# Patient Record
Sex: Male | Born: 1970 | Race: Black or African American | Hispanic: No | Smoking: Never smoker
Health system: Southern US, Community
[De-identification: ages and names within clinical notes are randomized; demographics above are authoritative.]

## PROBLEM LIST (undated history)

## (undated) DIAGNOSIS — E669 Obesity, unspecified: Secondary | ICD-10-CM

## (undated) DIAGNOSIS — I10 Essential (primary) hypertension: Secondary | ICD-10-CM

## (undated) HISTORY — DX: Obesity, unspecified: E66.9

## (undated) HISTORY — DX: Essential (primary) hypertension: I10

---

## 2006-06-29 ENCOUNTER — Emergency Department (HOSPITAL_COMMUNITY): Admission: EM | Admit: 2006-06-29 | Discharge: 2006-06-29 | Payer: Self-pay | Admitting: Family Medicine

## 2010-09-19 ENCOUNTER — Ambulatory Visit (INDEPENDENT_AMBULATORY_CARE_PROVIDER_SITE_OTHER): Payer: BC Managed Care – PPO | Admitting: Internal Medicine

## 2010-09-19 ENCOUNTER — Other Ambulatory Visit (INDEPENDENT_AMBULATORY_CARE_PROVIDER_SITE_OTHER): Payer: BC Managed Care – PPO

## 2010-09-19 ENCOUNTER — Other Ambulatory Visit: Payer: Self-pay | Admitting: Internal Medicine

## 2010-09-19 ENCOUNTER — Encounter: Payer: Self-pay | Admitting: Internal Medicine

## 2010-09-19 VITALS — BP 152/98 | HR 88 | Temp 99.2°F | Ht 72.0 in | Wt 263.0 lb

## 2010-09-19 DIAGNOSIS — Z0000 Encounter for general adult medical examination without abnormal findings: Secondary | ICD-10-CM

## 2010-09-19 DIAGNOSIS — I10 Essential (primary) hypertension: Secondary | ICD-10-CM

## 2010-09-19 DIAGNOSIS — M549 Dorsalgia, unspecified: Secondary | ICD-10-CM | POA: Insufficient documentation

## 2010-09-19 DIAGNOSIS — Z23 Encounter for immunization: Secondary | ICD-10-CM

## 2010-09-19 HISTORY — DX: Essential (primary) hypertension: I10

## 2010-09-19 LAB — URINALYSIS, ROUTINE W REFLEX MICROSCOPIC
Bilirubin Urine: NEGATIVE
Hgb urine dipstick: NEGATIVE
Ketones, ur: NEGATIVE
Nitrite: NEGATIVE
Total Protein, Urine: NEGATIVE
pH: 5.5 (ref 5.0–8.0)

## 2010-09-19 LAB — CBC WITH DIFFERENTIAL/PLATELET
Basophils Relative: 0.5 % (ref 0.0–3.0)
Eosinophils Absolute: 0.1 10*3/uL (ref 0.0–0.7)
Eosinophils Relative: 0.6 % (ref 0.0–5.0)
HCT: 49 % (ref 39.0–52.0)
Lymphs Abs: 2.4 10*3/uL (ref 0.7–4.0)
MCHC: 34.1 g/dL (ref 30.0–36.0)
MCV: 87.1 fl (ref 78.0–100.0)
Monocytes Absolute: 0.7 10*3/uL (ref 0.1–1.0)
Neutrophils Relative %: 70.6 % (ref 43.0–77.0)
Platelets: 277 10*3/uL (ref 150.0–400.0)
RBC: 5.63 Mil/uL (ref 4.22–5.81)

## 2010-09-19 LAB — BASIC METABOLIC PANEL
Chloride: 103 mEq/L (ref 96–112)
GFR: 93.32 mL/min (ref >60.00)
Sodium: 141 mEq/L (ref 135–145)

## 2010-09-19 LAB — LIPID PANEL: Total CHOL/HDL Ratio: 5

## 2010-09-19 LAB — HEPATIC FUNCTION PANEL
ALT: 24 U/L (ref 0–53)
AST: 24 U/L (ref 0–37)
Bilirubin, Direct: 0.2 mg/dL (ref 0.0–0.3)
Total Bilirubin: 1.3 mg/dL — ABNORMAL HIGH (ref 0.3–1.2)

## 2010-09-19 MED ORDER — AMLODIPINE BESYLATE 5 MG PO TABS: 5.0000 mg | ORAL_TABLET | Freq: Every day | ORAL | Status: DC

## 2010-09-19 NOTE — Progress Notes (Signed)
  Subjective:    Patient ID: Eric Horne, male    DOB: 03-27-1970, 40 y.o.   MRN: [id number]  HPI  Here for wellness and f/u;  Overall doing ok;  Pt denies CP, worsening SOB, DOE, wheezing, orthopnea, PND, worsening LE edema, palpitations, dizziness or syncope.  Pt denies neurological change such as new Headache, facial or extremity weakness.  Pt denies polydipsia, polyuria, or low sugar symptoms. Pt states overall good compliance with treatment and medications, good tolerability, and trying to follow lower cholesterol diet.  Pt denies worsening depressive symptoms, suicidal ideation or panic. No fever, wt loss, night sweats, loss of appetite, or other constitutional symptoms.  Pt states good ability with ADL's, low fall risk, home safety reviewed and adequate, no significant changes in hearing or vision, and occasionally active with exercise.  Has not check BP anywhere recently. Past Medical History  Diagnosis Date  . History of chicken pox    History reviewed. No pertinent past surgical history.  reports that he has never smoked. He does not have any smokeless tobacco history on file. He reports that he does not drink alcohol or use illicit drugs. family history includes Heart disease in his mother. No Known Allergies No current outpatient prescriptions on file prior to visit.   Review of Systems Review of Systems  Constitutional: Negative for diaphoresis, activity change, appetite change and unexpected weight change.  HENT: Negative for hearing loss, ear pain, facial swelling, mouth sores and neck stiffness.   Eyes: Negative for pain, redness and visual disturbance.  Respiratory: Negative for shortness of breath and wheezing.   Cardiovascular: Negative for chest pain and palpitations.  Gastrointestinal: Negative for diarrhea, blood in stool, abdominal distention and rectal pain.  Genitourinary: Negative for hematuria, flank pain and decreased urine volume.  Musculoskeletal: Negative  for myalgias and joint swelling.  Did have some muscle pull in the lower back for 1 wk, almost resolved now, started with bending. Skin: Negative for color change and wound.  Neurological: Negative for syncope and numbness.  Hematological: Negative for adenopathy.  Psychiatric/Behavioral: Negative for hallucinations, self-injury, decreased concentration and agitation.      Objective:   Physical Exam BP 152/98  Pulse 88  Temp(Src) 99.2 F (37.3 C) (Oral)  Ht 6' (1.829 m)  Wt 263 lb (119.296 kg)  BMI 35.67 kg/m2  SpO2 95% Physical Exam  VS noted Constitutional: Pt is oriented to person, place, and time. Appears well-developed and well-nourished.  HENT:  Head: Normocephalic and atraumatic.  Right Ear: External ear normal.  Left Ear: External ear normal.  Nose: Nose normal.  Mouth/Throat: Oropharynx is clear and moist.  Eyes: Conjunctivae and EOM are normal. Pupils are equal, round, and reactive to light.  Neck: Normal range of motion. Neck supple. No JVD present. No tracheal deviation present.  Cardiovascular: Normal rate, regular rhythm, normal heart sounds and intact distal pulses.   Pulmonary/Chest: Effort normal and breath sounds normal.  Abdominal: Soft. Bowel sounds are normal. There is no tenderness.  Musculoskeletal: Normal range of motion. Exhibits no edema.  Lymphadenopathy:  Has no cervical adenopathy.  Neurological: Pt is alert and oriented to person, place, and time. Pt has normal reflexes. No cranial nerve deficit.  Skin: Skin is warm and dry. No rash noted.  Psychiatric:  Has  normal mood and affect. Behavior is normal.  Spine nontender and no signficiant paravertebral tender      Assessment & Plan:

## 2010-09-19 NOTE — Assessment & Plan Note (Signed)
Right lower back, c/w strain since resolved, ok to follow

## 2010-09-19 NOTE — Assessment & Plan Note (Signed)

## 2010-09-19 NOTE — Patient Instructions (Addendum)
You had the tetanus shot today Your EKG was OK today Please start amlodipine 5 mg per day for blood pressure (very easy to take, no side effects) Please check your Blood Pressure on a regular basis, such as with a home Arm Blood Pressure monitor;  Your goal is to be less than 140/90 Please go to LAB in the Basement for the blood and/or urine tests to be done today Please call the phone number [phone] (the [employer] System) for results of testing in 2-3 days;  When calling, simply dial the number, and when prompted enter the MRN number above (the Medical Record Number) and the # key, then the message should start. Please also stop using table salt in the food Please be more active, reduce calories, and try to lose approximately 20 lbs Please return in 6 months, or sooner if needed

## 2010-09-19 NOTE — Assessment & Plan Note (Signed)
New onset, to start amlodipine 5 mg qd, avoid table salt, more exercise, wt loss

## 2010-09-20 ENCOUNTER — Encounter: Payer: Self-pay | Admitting: Internal Medicine

## 2010-09-20 DIAGNOSIS — E785 Hyperlipidemia, unspecified: Secondary | ICD-10-CM | POA: Insufficient documentation

## 2010-09-20 LAB — LDL CHOLESTEROL, DIRECT: Direct LDL: 186.9 mg/dL

## 2011-03-22 ENCOUNTER — Ambulatory Visit: Payer: BC Managed Care – PPO | Admitting: Internal Medicine

## 2011-04-02 ENCOUNTER — Ambulatory Visit (INDEPENDENT_AMBULATORY_CARE_PROVIDER_SITE_OTHER): Payer: BC Managed Care – PPO | Admitting: Internal Medicine

## 2011-04-02 ENCOUNTER — Encounter: Payer: Self-pay | Admitting: Internal Medicine

## 2011-04-02 VITALS — BP 134/90 | HR 93 | Temp 97.7°F | Ht 73.0 in | Wt 251.8 lb

## 2011-04-02 DIAGNOSIS — E669 Obesity, unspecified: Secondary | ICD-10-CM | POA: Insufficient documentation

## 2011-04-02 DIAGNOSIS — Z0000 Encounter for general adult medical examination without abnormal findings: Secondary | ICD-10-CM

## 2011-04-02 DIAGNOSIS — I10 Essential (primary) hypertension: Secondary | ICD-10-CM

## 2011-04-02 DIAGNOSIS — E785 Hyperlipidemia, unspecified: Secondary | ICD-10-CM

## 2011-04-02 HISTORY — DX: Obesity, unspecified: E66.9

## 2011-04-02 MED ORDER — LOSARTAN POTASSIUM 100 MG PO TABS: 100.0000 mg | ORAL_TABLET | Freq: Every day | ORAL | Status: AC

## 2011-04-02 NOTE — Assessment & Plan Note (Signed)
Uncontrolled, most recent data reviewed with pt, and pt to continue medical treatment as before except add losartan 100qd BP Readings from Last 3 Encounters:  04/02/11 134/90  09/19/10 152/98

## 2011-04-02 NOTE — Patient Instructions (Signed)
Take all new medications as prescribed - the losartan Continue all other medications as before - the amlodipine Please continue to lose wt with diet control and more exercise Please follow lower cholesterol diet Please return in 6 mo with Lab testing done 3-5 days before

## 2011-04-02 NOTE — Assessment & Plan Note (Signed)
Last a1c 6 mo ago over 180;  For lower chol diet, declines statin at this time, re-check lipids next visit , goal < 100, consider statin

## 2011-04-02 NOTE — Assessment & Plan Note (Signed)
Has lost some wt with better diet and exercise from 263 to 251, to cont wt loss efforts, diet, exercise at least 20 min 3 times wkly

## 2011-04-02 NOTE — Progress Notes (Signed)
  Subjective:    Patient ID: Eric Horne, male    DOB: February 06, 1971, 41 y.o.   MRN: [id number]  HPI   Here to f/u; overall doing ok,  Pt denies chest pain, increased sob or doe, wheezing, orthopnea, PND, increased LE swelling, palpitations, dizziness or syncope.  Pt denies new neurological symptoms such as new headache, or facial or extremity weakness or numbness   Pt denies polydipsia, polyuria.  Pt states overall good compliance with meds, trying to follow lower cholesterol diet, wt overall stable but little exercise however.  Pt denies fever, wt loss, night sweats, loss of appetite, or other constitutional symptoms  Denies worsening depressive symptoms, suicidal ideation, or panic. Past Medical History  Diagnosis Date  . HTN (hypertension) 09/19/2010  . Obesity 04/02/2011   No past surgical history on file.  reports that he has never smoked. He does not have any smokeless tobacco history on file. He reports that he does not drink alcohol or use illicit drugs. family history includes Heart disease in his mother. No Known Allergies Current Outpatient Prescriptions on File Prior to Visit  Medication Sig Dispense Refill  . amLODipine (NORVASC) 5 MG tablet Take 1 tablet (5 mg total) by mouth daily.  90 tablet  3   Review of Systems Review of Systems  Constitutional: Negative for diaphoresis and unexpected weight change.  HENT: Negative for drooling and tinnitus.   Eyes: Negative for photophobia and visual disturbance.  Respiratory: Negative for choking and stridor.   Gastrointestinal: Negative for vomiting and blood in stool.  Genitourinary: Negative for hematuria and decreased urine volume.  Musculoskeletal: Negative for gait problem.  Skin: Negative for color change and wound.    Objective:   Physical Exam BP 134/90  Pulse 93  Temp(Src) 97.7 F (36.5 C) (Oral)  Ht 6\' 1"  (1.854 m)  Wt 251 lb 12.8 oz (114.216 kg)  BMI 33.22 kg/m2  SpO2 95% Physical Exam  VS noted Constitutional:  Pt appears well-developed and well-nourished.  HENT: Head: Normocephalic.  Right Ear: External ear normal.  Left Ear: External ear normal.  Eyes: Conjunctivae and EOM are normal. Pupils are equal, round, and reactive to light.  Neck: Normal range of motion. Neck supple.  Cardiovascular: Normal rate and regular rhythm.   Pulmonary/Chest: Effort normal and breath sounds normal.  Abd:  Soft, NT, non-distended, + BS Neurological: Pt is alert. No cranial nerve deficit.  Skin: Skin is warm. No erythema.  Psychiatric: Pt behavior is normal. Thought content normal.     Assessment & Plan:

## 2011-10-01 ENCOUNTER — Ambulatory Visit: Payer: BC Managed Care – PPO | Admitting: Internal Medicine

## 2011-10-22 ENCOUNTER — Ambulatory Visit: Payer: BC Managed Care – PPO | Admitting: Internal Medicine

## 2011-11-02 ENCOUNTER — Other Ambulatory Visit: Payer: Self-pay | Admitting: Internal Medicine

## 2011-11-14 ENCOUNTER — Ambulatory Visit: Payer: BC Managed Care – PPO | Admitting: Internal Medicine

## 2011-11-28 ENCOUNTER — Ambulatory Visit: Payer: BC Managed Care – PPO | Admitting: Internal Medicine

## 2011-12-26 ENCOUNTER — Ambulatory Visit: Payer: BC Managed Care – PPO | Admitting: Internal Medicine

## 2012-01-18 ENCOUNTER — Ambulatory Visit: Payer: BC Managed Care – PPO | Admitting: Internal Medicine

## 2012-02-28 ENCOUNTER — Ambulatory Visit: Payer: BC Managed Care – PPO | Admitting: Internal Medicine

## 2012-02-28 DIAGNOSIS — Z0289 Encounter for other administrative examinations: Secondary | ICD-10-CM

## 2015-03-28 ENCOUNTER — Emergency Department (HOSPITAL_COMMUNITY)
Admission: EM | Admit: 2015-03-28 | Discharge: 2015-03-29 | Disposition: A | Discharge status: Home / Self Care | Payer: BC Managed Care – PPO | Attending: Emergency Medicine | Admitting: Emergency Medicine

## 2015-03-28 ENCOUNTER — Emergency Department (HOSPITAL_COMMUNITY): Payer: BC Managed Care – PPO

## 2015-03-28 ENCOUNTER — Encounter (HOSPITAL_COMMUNITY): Payer: Self-pay | Admitting: Emergency Medicine

## 2015-03-28 DIAGNOSIS — E669 Obesity, unspecified: Secondary | ICD-10-CM | POA: Insufficient documentation

## 2015-03-28 DIAGNOSIS — R0789 Other chest pain: Secondary | ICD-10-CM | POA: Insufficient documentation

## 2015-03-28 DIAGNOSIS — I10 Essential (primary) hypertension: Secondary | ICD-10-CM | POA: Insufficient documentation

## 2015-03-28 LAB — CBC
HEMATOCRIT: 46.9 % (ref 39.0–52.0)
HEMOGLOBIN: 15.4 g/dL (ref 13.0–17.0)
MCH: 28 pg (ref 26.0–34.0)
MCHC: 32.8 g/dL (ref 30.0–36.0)
MCV: 85.3 fL (ref 78.0–100.0)
Platelets: 237 10*3/uL (ref 150–400)
RBC: 5.5 MIL/uL (ref 4.22–5.81)
RDW: 13.7 % (ref 11.5–15.5)
WBC: 10.4 10*3/uL (ref 4.0–10.5)

## 2015-03-28 LAB — I-STAT TROPONIN, ED: TROPONIN I, POC: 0 ng/mL (ref 0.00–0.08)

## 2015-03-28 LAB — BASIC METABOLIC PANEL
ANION GAP: 12 (ref 5–15)
BUN: 14 mg/dL (ref 6–20)
CHLORIDE: 109 mmol/L (ref 101–111)
CO2: 23 mmol/L (ref 22–32)
Calcium: 9.2 mg/dL (ref 8.9–10.3)
Creatinine, Ser: 1.1 mg/dL (ref 0.61–1.24)
GFR calc Af Amer: >60 mL/min (ref >60)
GLUCOSE: 104 mg/dL — AB (ref 65–99)
POTASSIUM: 3.9 mmol/L (ref 3.5–5.1)
Sodium: 144 mmol/L (ref 135–145)

## 2015-03-28 NOTE — ED Notes (Signed)
Pt c/o left chest pain x 1 week.

## 2015-03-28 NOTE — ED Provider Notes (Signed)
CSN: [id number]     Arrival date & time 03/28/15  1744 History  By signing my name below, I, Tanda Rockers, attest that this documentation has been prepared under the direction and in the presence of Leighana Neyman, MD. Electronically Signed: Tanda Rockers, ED Scribe. 03/28/2015. 11:53 PM.   Chief Complaint  Patient presents with  . Chest Pain   Patient is a 45 y.o. male presenting with chest pain. The history is provided by the patient. No language interpreter was used.  Chest Pain Pain location:  L chest Pain quality: tightness   Pain radiates to:  Does not radiate Pain radiates to the back: no   Pain severity:  Moderate Onset quality:  Gradual Duration:  1 week Timing:  Constant Progression:  Waxing and waning Chronicity:  New Context: movement and raising an arm   Relieved by:  Nothing Worsened by:  Nothing tried Associated symptoms: no cough, no diaphoresis, no lower extremity edema, no nausea, no orthopnea, no palpitations, no PND, no shortness of breath, no syncope and not vomiting   Risk factors: no aortic disease and no smoking      HPI Comments: Eric Horne is a 45 y.o. male with hx HTN who presents to the Emergency Department complaining of gradual onset, intermittent, tight, left sided chest pain x 1 week. The pain is only present with certain movements, especially raising both arms. It is not worsened by food or during different time of the days. Denies leg swelling, leg pain, nausea, vomiting, diaphoresis, shortness of breath, cough, or any other associated symptoms. No recent prolonged travel. No recent new strenuous activity. Pt is non smoker. He has never had a stress test in the past. FHx mother with Mi  Past Medical History  Diagnosis Date  . HTN (hypertension) 09/19/2010  . Obesity 04/02/2011   History reviewed. No pertinent past surgical history. Family History  Problem Relation Age of Onset  . Heart disease Mother    Social History  Substance Use  Topics  . Smoking status: Never Smoker   . Smokeless tobacco: None  . Alcohol Use: No    Review of Systems  Constitutional: Negative for diaphoresis.  Respiratory: Negative for cough and shortness of breath.   Cardiovascular: Positive for chest pain. Negative for palpitations, orthopnea, leg swelling, syncope and PND.  Gastrointestinal: Negative for nausea and vomiting.  All other systems reviewed and are negative.     Allergies  Review of patient's allergies indicates no known allergies.  Home Medications   Prior to Admission medications   Medication Sig Start Date End Date Taking? Authorizing Provider  amLODipine (NORVASC) 5 MG tablet TAKE 1 TABLET (5 MG TOTAL) BY MOUTH DAILY. Patient not taking: Reported on 03/28/2015 11/02/11   Corwin Levins, MD  losartan (COZAAR) 100 MG tablet Take 1 tablet (100 mg total) by mouth daily. Patient not taking: Reported on 03/28/2015 04/02/11 04/01/12  Corwin Levins, MD   BP 152/98 mmHg  Pulse 92  Temp(Src) 98.2 F (36.8 C) (Oral)  Resp 16  SpO2 98%   Physical Exam  Constitutional: He is oriented to person, place, and time. He appears well-developed and well-nourished. No distress.  HENT:  Head: Normocephalic and atraumatic.  Mouth/Throat: Oropharynx is clear and moist. No oropharyngeal exudate.  Eyes: Conjunctivae and EOM are normal. Pupils are equal, round, and reactive to light.  Neck: Normal range of motion. Neck supple. Carotid bruit is not present. No tracheal deviation present.  Cardiovascular: Normal rate, regular rhythm  and normal heart sounds.   Pulmonary/Chest: Effort normal and breath sounds normal. No stridor. No respiratory distress. He has no wheezes. He has no rales. He exhibits no tenderness.  No reproducible chest pain  Abdominal: Soft. Bowel sounds are normal. There is no tenderness. There is no rebound and no guarding.  Musculoskeletal: Normal range of motion. He exhibits no edema or tenderness.  Neurological: He is alert  and oriented to person, place, and time. He has normal reflexes.  Skin: Skin is warm and dry. He is not diaphoretic.  Psychiatric: He has a normal mood and affect. His behavior is normal.  Nursing note and vitals reviewed.   ED Course  Procedures (including critical care time)  DIAGNOSTIC STUDIES: Oxygen Saturation is 98% on RA, normal by my interpretation.    COORDINATION OF CARE: 11:52 PM-Discussed treatment plan which includes resource guide to PCP with pt at bedside and pt agreed to plan.   Labs Review Labs Reviewed  BASIC METABOLIC PANEL - Abnormal; Notable for the following:    Glucose, Bld 104 (*)    All other components within normal limits  CBC  I-STAT TROPOININ, ED    Imaging Review Dg Chest 2 View  03/28/2015  CLINICAL DATA:  45 year old male with left chest pain for the past week EXAM: CHEST  2 VIEW COMPARISON:  None. FINDINGS: The lungs are clear and negative for focal airspace consolidation, pulmonary edema or suspicious pulmonary nodule. No pleural effusion or pneumothorax. Cardiac and mediastinal contours are within normal limits. No acute fracture or lytic or blastic osseous lesions. The visualized upper abdominal bowel gas pattern is unremarkable. IMPRESSION: No active cardiopulmonary disease. Electronically Signed   By: Malachy Moan M.D.   On: 03/28/2015 18:51   I have personally reviewed and evaluated these images and lab results as part of my medical decision-making.   EKG Interpretation None       EKG Interpretation  Date/Time:  Tuesday March 29 2015 00:27:47 EST Ventricular Rate:  65 PR Interval:  152 QRS Duration: 95 QT Interval:  360 QTC Calculation: 374 R Axis:   17 Text Interpretation:  Sinus rhythm Confirmed by [facility]  MD, Faun Mcqueen ([id number]) on 03/29/2015 12:30:01 AM       MDM   Final diagnoses:  None   PERC negative Wells 0, highly doubt PE.    In the setting of pain ongoing for > 8 hours of pain with negative EKG and  troponin has ruled out for MI.  Symptoms are consistent with MSK pain. Given mother's h/o MI will refer to cardiology for stress test.  Strict return precautions given.  Patient agrees to follow up   I personally performed the services described in this documentation, which was scribed in my presence. The recorded information has been reviewed and is accurate.        Cy Blamer, MD 03/29/15 [id number]

## 2015-03-29 ENCOUNTER — Encounter (HOSPITAL_COMMUNITY): Payer: Self-pay | Admitting: Emergency Medicine

## 2015-03-29 MED ORDER — METHOCARBAMOL 500 MG PO TABS
1000.0000 mg | ORAL_TABLET | Freq: Once | ORAL | Status: AC
  Administered 2015-03-29: 1000 mg via ORAL
  Filled 2015-03-29: qty 2

## 2015-03-29 MED ORDER — METHOCARBAMOL 500 MG PO TABS: 500.0000 mg | ORAL_TABLET | Freq: Two times a day (BID) | ORAL | Status: AC

## 2015-03-29 MED ORDER — IBUPROFEN 800 MG PO TABS: 800.0000 mg | ORAL_TABLET | Freq: Three times a day (TID) | ORAL | Status: AC

## 2015-03-29 MED ORDER — IBUPROFEN 800 MG PO TABS
800.0000 mg | ORAL_TABLET | Freq: Once | ORAL | Status: AC
  Administered 2015-03-29: 800 mg via ORAL
  Filled 2015-03-29: qty 1

## 2015-03-29 MED ORDER — GI COCKTAIL ~~LOC~~
30.0000 mL | Freq: Once | ORAL | Status: AC
  Administered 2015-03-29: 30 mL via ORAL
  Filled 2015-03-29: qty 30

## 2015-03-29 NOTE — Discharge Instructions (Signed)

## 2016-07-19 IMAGING — CR DG CHEST 2V
2 series · 2 of 2 positions shown · non-contrast
Comparison: None.

CLINICAL DATA: 44-year-old male with left chest pain for the past
week

EXAM:
CHEST  2 VIEW

[w chest pa]
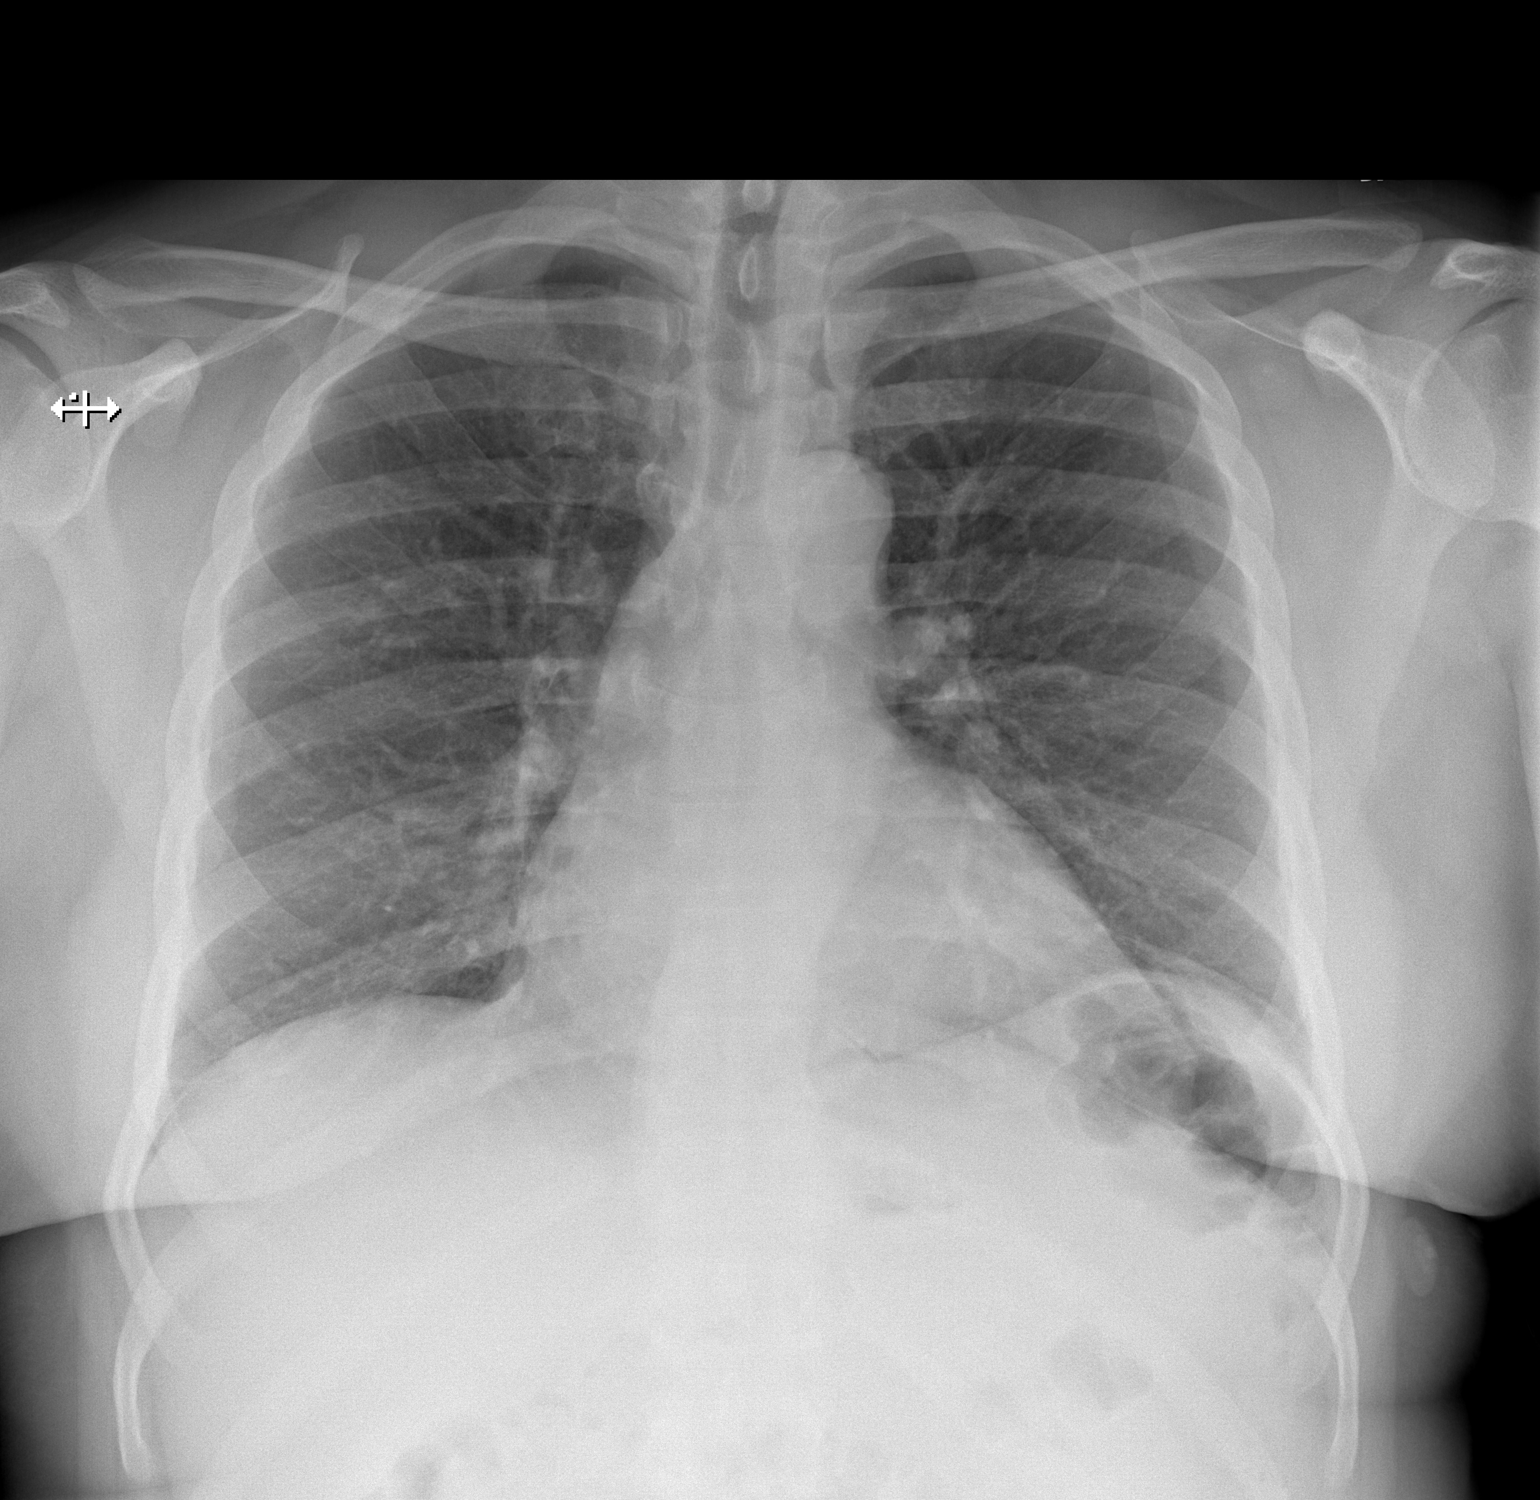

[w chest lat]
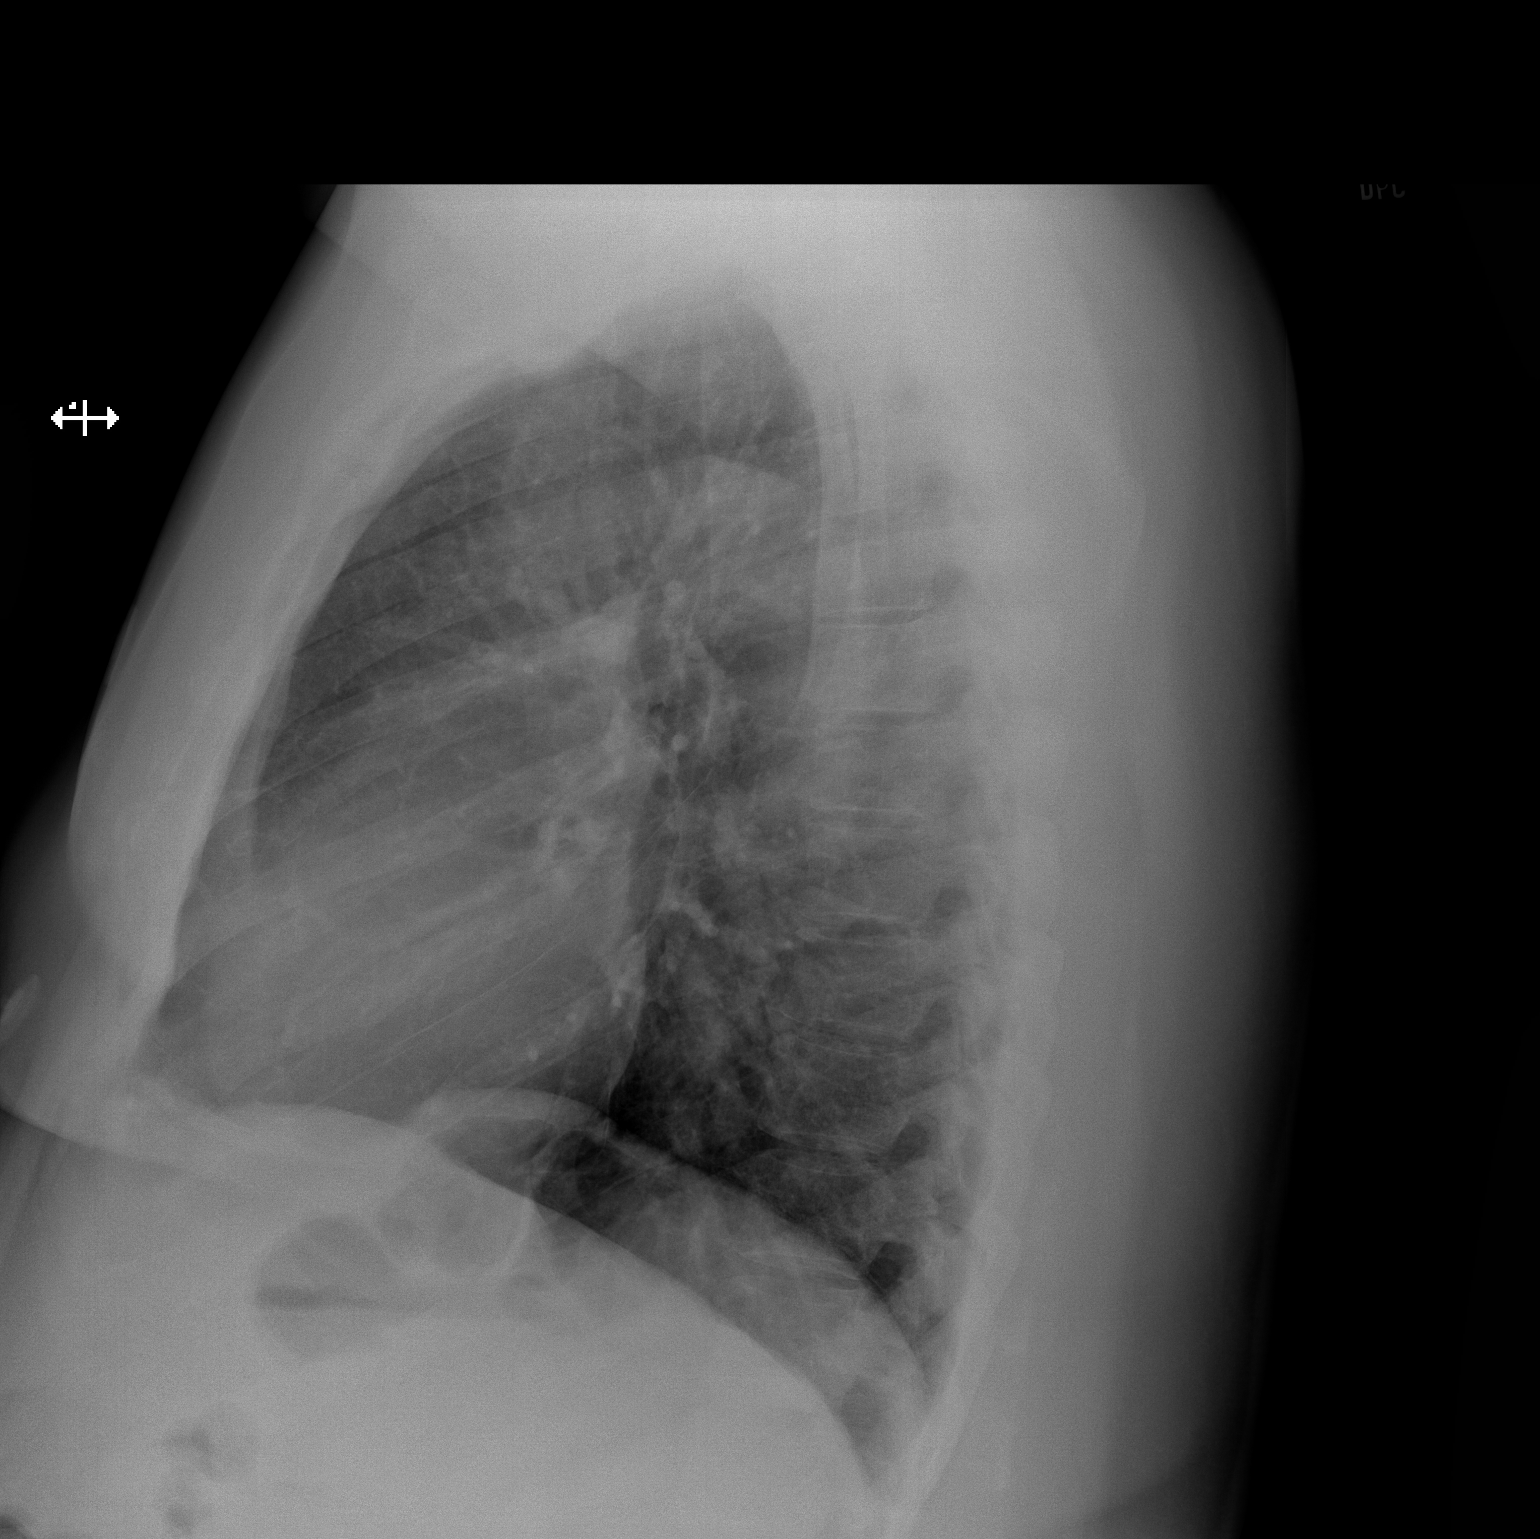

[2 of 2 positions shown; findings below may reference images not displayed]

FINDINGS: The lungs are clear and negative for focal airspace consolidation,
pulmonary edema or suspicious pulmonary nodule. No pleural effusion
or pneumothorax. Cardiac and mediastinal contours are within normal
limits. No acute fracture or lytic or blastic osseous lesions. The
visualized upper abdominal bowel gas pattern is unremarkable.
IMPRESSION: No active cardiopulmonary disease.

## 2019-01-20 ENCOUNTER — Other Ambulatory Visit: Payer: Self-pay

## 2019-01-20 DIAGNOSIS — Z20822 Contact with and (suspected) exposure to covid-19: Secondary | ICD-10-CM

## 2019-01-21 LAB — NOVEL CORONAVIRUS, NAA: SARS-CoV-2, NAA: NOT DETECTED

## 2019-04-14 ENCOUNTER — Encounter (HOSPITAL_COMMUNITY): Payer: Self-pay

## 2019-04-14 ENCOUNTER — Other Ambulatory Visit: Payer: Self-pay

## 2019-04-14 ENCOUNTER — Ambulatory Visit (HOSPITAL_COMMUNITY)
Admission: EM | Admit: 2019-04-14 | Discharge: 2019-04-14 | Disposition: A | Discharge status: Home / Self Care | Payer: 59 | Attending: Physician Assistant | Admitting: Physician Assistant

## 2019-04-14 DIAGNOSIS — B309 Viral conjunctivitis, unspecified: Secondary | ICD-10-CM

## 2019-04-14 MED ORDER — OLOPATADINE HCL 0.2 % OP SOLN: 1.0000 [drp] | Freq: Two times a day (BID) | OPHTHALMIC | 0 refills | Status: AC | PRN

## 2019-04-14 MED ORDER — OLOPATADINE HCL 0.2 % OP SOLN: 2.0000 [drp] | Freq: Two times a day (BID) | OPHTHALMIC | 0 refills | Status: DC | PRN

## 2019-04-14 MED ORDER — TETRACAINE HCL 0.5 % OP SOLN
OPHTHALMIC | Status: AC
  Filled 2019-04-14: qty 4

## 2019-04-14 NOTE — Discharge Instructions (Signed)
Use the eyedrops up to twice a day 1 drop.  I believe this is viral conjunctivitis.  As we discussed there is no clear-cut time for return to work.  It is highly contagious and you need to exercise excellent hand hygiene and attempt to not touch her eyes.  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  Otherwise I would need you to follow-up with your primary care to discuss this  If your eye becomes painful, you have significant decrease in your vision or have a lot of discharge I would like for you to return.

## 2019-04-14 NOTE — ED Provider Notes (Signed)
[place]    CSN: [id number] Arrival date & time: 04/14/19  1142      History   Chief Complaint Chief Complaint  Patient presents with  . Eye Problem    HPI Eric Horne is a 49 y.o. male.   Patient presents to urgent care with 2 to 3 days of itchy left eye.  Patient reports that a colleague at work noticed that his eye was red today.  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  He denies pain, blurry vision or significant discharge.  He does note that has been watery and tearful at times.  He does not recall injuring the eye.  He reports he had a runny nose about a week ago.  He is unsure if anyone in the daycare employee or children have had recent viral conjunctivitis.  Patient does not use any drops to this point for his eyes.  She reports if not notified by his director he would have otherwise not paid much attention to the eye.  He also denies anyone having similar symptoms at home.  Patient does wear glasses but does not wear contacts.     Past Medical History:  Diagnosis Date  . HTN (hypertension) 09/19/2010  . Obesity 04/02/2011    Patient Active Problem List   Diagnosis Date Noted  . Obesity 04/02/2011  . Hyperlipidemia 09/20/2010  . Back pain 09/19/2010  . Preventative health care 09/19/2010  . HTN (hypertension) 09/19/2010    History reviewed. No pertinent surgical history.     Home Medications    Prior to Admission medications   Medication Sig Start Date End Date Taking? Authorizing Provider  amLODipine (NORVASC) 5 MG tablet TAKE 1 TABLET (5 MG TOTAL) BY MOUTH DAILY. Patient not taking: Reported on 03/28/2015 11/02/11   Biagio Borg, MD  ibuprofen (ADVIL,MOTRIN) 800 MG tablet Take 1 tablet (800 mg total) by mouth 3 (three) times daily. 03/29/15   Palumbo, April, MD  losartan (COZAAR) 100 MG tablet Take 1 tablet (100 mg total) by mouth daily. Patient not taking: Reported on 03/28/2015 04/02/11 04/01/12   Biagio Borg, MD  methocarbamol (ROBAXIN) 500 MG tablet Take 1 tablet (500 mg total) by mouth 2 (two) times daily. 03/29/15   Palumbo, April, MD  Olopatadine HCl 0.2 % SOLN Apply 1 drop to eye 2 (two) times daily as needed. Use for duration of symptoms as needed 04/14/19   Sherill Wegener, Marguerita Beards, PA-C    Family History Family History  Problem Relation Age of Onset  . Heart disease Mother     Social History Social History   Tobacco Use  . Smoking status: Never Smoker  Substance Use Topics  . Alcohol use: No  . Drug use: No     Allergies   Patient has no known allergies.   Review of Systems Review of Systems  Constitutional: Negative for chills and fever.  HENT: Negative for congestion, rhinorrhea, sneezing and sore throat.   Eyes: Positive for discharge, redness and itching. Negative for pain and visual disturbance.  Respiratory: Negative for cough.   Cardiovascular: Negative for chest pain.  Gastrointestinal: Negative for abdominal pain.  Musculoskeletal: Negative for arthralgias, back pain and myalgias.  Skin: Negative for color change and rash.  Neurological: Negative for headaches.  All other systems reviewed and are negative.    Physical Exam Triage Vital Signs ED Triage Vitals  Enc Vitals Group     BP  04/14/19 1223 (!) 153/86     Pulse Rate 04/14/19 1223 94     Resp 04/14/19 1223 18     Temp 04/14/19 1223 98.8 F (37.1 C)     Temp Source 04/14/19 1223 Oral     SpO2 04/14/19 1223 98 %     Weight --      Height --      Head Circumference --      Peak Flow --      Pain Score 04/14/19 1219 0     Pain Loc --      Pain Edu? --      Excl. in GC? --    No data found.  Updated Vital Signs BP (!) 153/86 (BP Location: Right Arm)   Pulse 94   Temp 98.8 F (37.1 C) (Oral)   Resp 18   SpO2 98%   Visual Acuity Right Eye Distance: 20/30 Left Eye Distance: 20/25 Bilateral Distance: 20/20  Right Eye Near:   Left Eye Near:    Bilateral Near:     Physical  Exam Vitals and nursing note reviewed.  Constitutional:      General: He is not in acute distress.    Appearance: He is well-developed.  HENT:     Head: Normocephalic and atraumatic.     Nose: Nose normal.     Mouth/Throat:     Mouth: Mucous membranes are moist.     Pharynx: Oropharynx is clear.  Eyes:     General: Lids are normal. Lids are everted, no foreign bodies appreciated. No allergic shiner, visual field deficit or scleral icterus.       Right eye: No hordeolum.        Left eye: No discharge or hordeolum.     Extraocular Movements:     Right eye: Normal extraocular motion.     Left eye: Normal extraocular motion.     Conjunctiva/sclera:     Right eye: Right conjunctiva is not injected. No chemosis.    Left eye: Left conjunctiva is injected. Exudate (Clear exudate/tears) present. No chemosis.    Comments: Fluorescein exam conducted in left eye without uptake or signs of abrasion.  There is possible evidence of crusting in the eyelashes.  No obvious sign of blepharitis or other irritation of meibomian glands   Cardiovascular:     Rate and Rhythm: Normal rate.  Pulmonary:     Effort: Pulmonary effort is normal. No respiratory distress.  Musculoskeletal:     Cervical back: Neck supple.     Right lower leg: Edema present.  Skin:    General: Skin is warm and dry.  Neurological:     Mental Status: He is alert and oriented to person, place, and time.  Psychiatric:        Mood and Affect: Mood normal.        Behavior: Behavior normal.        Thought Content: Thought content normal.        Judgment: Judgment normal.      UC Treatments / Results  Labs (all labs ordered are listed, but only abnormal results are displayed) Labs Reviewed - No data to display  EKG   Radiology No results found.  Procedures Procedures (including critical care time)  Medications Ordered in UC Medications - No data to display  Initial Impression / Assessment and Plan / UC Course  I  have reviewed the triage vital signs and the nursing notes.  Pertinent labs & imaging results that  were available during my care of the patient were reviewed by me and considered in my medical decision making (see chart for details).     #Viral conjunctivitis Patient is a 49 year old male presenting with symptoms consistent with viral conjunctivitis.  Fluorescein uptake negative and no reported pain.  Visual acuity is normal with left eye actually having better vision than right current.  We will send out with symptomatic care with Pataday drops and strict hand hygiene precautions.  We discussed that there is no consensus agreement on duration to stay out of work.  Work note was given and instructed patient to discuss with his daycare center about his return to work schedule.  Patient understands the plan.  Follow-up precautions were discussed patient understands to return if worsening vision or pain develops Final Clinical Impressions(s) / UC Diagnoses   Final diagnoses:  Acute viral conjunctivitis of left eye     Discharge Instructions     Use the eyedrops up to twice a day 1 drop.  I believe this is viral conjunctivitis.  As we discussed there is no clear-cut time for return to work.  It is highly contagious and you need to exercise excellent hand hygiene and attempt to not touch her eyes.  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  Otherwise I would need you to follow-up with your primary care to discuss this  If your eye becomes painful, you have significant decrease in your vision or have a lot of discharge I would like for you to return.      ED Prescriptions    Medication Sig Dispense Auth. Provider   Olopatadine HCl 0.2 % SOLN  (Status: Discontinued) Apply 2 drops to eye 2 (two) times daily as needed. Use  for duration of symptoms as needed 2.5 mL Tram Wrenn, Veryl Speak, PA-C   Olopatadine HCl 0.2 % SOLN Apply 1 drop to eye 2 (two) times daily as needed. Use for duration of symptoms as needed 2.5 mL Christiana Gurevich, Veryl Speak, PA-C     PDMP not reviewed this encounter.   Hermelinda Medicus, PA-C 04/14/19 2235

## 2019-04-14 NOTE — ED Triage Notes (Addendum)
Pt reports left eye swelling/irritation onset this morning. Left eye with edema to upper lid, mild redness to sclera. Pt denies vision being affected, tearing, or exudate.  Pt states he decided to quit taking bp meds of own accord.  Denies injury to area. Works at [employer].

## 2023-07-14 ENCOUNTER — Encounter (HOSPITAL_COMMUNITY): Payer: Self-pay | Admitting: Emergency Medicine

## 2023-07-14 ENCOUNTER — Other Ambulatory Visit: Payer: Self-pay

## 2023-07-14 ENCOUNTER — Emergency Department (HOSPITAL_COMMUNITY)
Admission: EM | Admit: 2023-07-14 | Discharge: 2023-07-15 | Disposition: A | Discharge status: Home / Self Care | Attending: Emergency Medicine | Admitting: Emergency Medicine

## 2023-07-14 ENCOUNTER — Emergency Department (HOSPITAL_COMMUNITY)

## 2023-07-14 DIAGNOSIS — R42 Dizziness and giddiness: Secondary | ICD-10-CM | POA: Diagnosis present

## 2023-07-14 DIAGNOSIS — I10 Essential (primary) hypertension: Secondary | ICD-10-CM | POA: Insufficient documentation

## 2023-07-14 DIAGNOSIS — Z79899 Other long term (current) drug therapy: Secondary | ICD-10-CM | POA: Diagnosis not present

## 2023-07-14 DIAGNOSIS — E860 Dehydration: Secondary | ICD-10-CM | POA: Insufficient documentation

## 2023-07-14 LAB — CBC WITH DIFFERENTIAL/PLATELET
Abs Immature Granulocytes: 0.05 10*3/uL (ref 0.00–0.07)
Basophils Absolute: 0 10*3/uL (ref 0.0–0.1)
Basophils Relative: 0 %
Eosinophils Absolute: 0 10*3/uL (ref 0.0–0.5)
Eosinophils Relative: 0 %
HCT: 48.5 % (ref 39.0–52.0)
Hemoglobin: 15.8 g/dL (ref 13.0–17.0)
Immature Granulocytes: 0 %
Lymphocytes Relative: 10 %
Lymphs Abs: 1.1 10*3/uL (ref 0.7–4.0)
MCH: 28.8 pg (ref 26.0–34.0)
MCHC: 32.6 g/dL (ref 30.0–36.0)
MCV: 88.3 fL (ref 80.0–100.0)
Monocytes Absolute: 0.5 10*3/uL (ref 0.1–1.0)
Monocytes Relative: 4 %
Neutro Abs: 9.9 10*3/uL — ABNORMAL HIGH (ref 1.7–7.7)
Neutrophils Relative %: 86 %
Platelets: 241 10*3/uL (ref 150–400)
RBC: 5.49 MIL/uL (ref 4.22–5.81)
RDW: 14.7 % (ref 11.5–15.5)
WBC: 11.6 10*3/uL — ABNORMAL HIGH (ref 4.0–10.5)
nRBC: 0 % (ref 0.0–0.2)

## 2023-07-14 LAB — COMPREHENSIVE METABOLIC PANEL WITH GFR
ALT: 22 U/L (ref 0–44)
AST: 24 U/L (ref 15–41)
Albumin: 3.6 g/dL (ref 3.5–5.0)
Alkaline Phosphatase: 78 U/L (ref 38–126)
Anion gap: 8 (ref 5–15)
BUN: 14 mg/dL (ref 6–20)
CO2: 23 mmol/L (ref 22–32)
Calcium: 8.4 mg/dL — ABNORMAL LOW (ref 8.9–10.3)
Chloride: 106 mmol/L (ref 98–111)
Creatinine, Ser: 1.18 mg/dL (ref 0.61–1.24)
GFR, Estimated: >60 mL/min (ref >60)
Glucose, Bld: 142 mg/dL — ABNORMAL HIGH (ref 70–99)
Potassium: 3.7 mmol/L (ref 3.5–5.1)
Sodium: 137 mmol/L (ref 135–145)
Total Bilirubin: 0.7 mg/dL (ref 0.0–1.2)
Total Protein: 6.9 g/dL (ref 6.5–8.1)

## 2023-07-14 LAB — TROPONIN I (HIGH SENSITIVITY): Troponin I (High Sensitivity): 5 ng/L (ref <18)

## 2023-07-14 LAB — LIPASE, BLOOD: Lipase: 32 U/L (ref 11–51)

## 2023-07-14 MED ORDER — ACETAMINOPHEN 325 MG PO TABS
650.0000 mg | ORAL_TABLET | Freq: Once | ORAL | Status: AC
  Administered 2023-07-14: 650 mg via ORAL
  Filled 2023-07-14: qty 2

## 2023-07-14 MED ORDER — SODIUM CHLORIDE 0.9 % IV BOLUS
1000.0000 mL | Freq: Once | INTRAVENOUS | Status: AC
  Administered 2023-07-14: 1000 mL via INTRAVENOUS

## 2023-07-14 MED ORDER — ONDANSETRON HCL 4 MG/2ML IJ SOLN
4.0000 mg | Freq: Once | INTRAMUSCULAR | Status: AC
  Administered 2023-07-14: 4 mg via INTRAVENOUS
  Filled 2023-07-14: qty 2

## 2023-07-14 NOTE — ED Provider Notes (Signed)
 Care assumed from Dr. Zammitt, patient with episode fo dizziness which has resolved. Repeat troponin is pending.  Repeat troponin is unchanged.  I am discharging the patient with instructions to follow-up with primary care provider, make sure he keeps himself well-hydrated.   Alissa April, MD 07/15/23 [phone]

## 2023-07-14 NOTE — ED Provider Notes (Signed)
 [facility] EMERGENCY DEPARTMENT AT [facility] Provider Note   CSN: [id number] Arrival date & time: 07/14/23  1945     History {Add pertinent medical, surgical, social history, OB history to HPI:1} Chief Complaint  Patient presents with   Dizziness    Eric Horne is a 53 y.o. male.  Patient has a history of hypertension.  Patient states he was moving all day and did not drink much.  He was at dinner today and felt dizzy and weak and got sweaty.  Patient feels fine now  The history is provided by the patient and medical records. No language interpreter was used.  Dizziness Quality:  Lightheadedness Severity:  Moderate Onset quality:  Sudden Timing:  Rare Progression:  Resolved Chronicity:  New Context: not when bending over   Relieved by:  Nothing Worsened by:  Nothing Ineffective treatments:  None tried Associated symptoms: no blood in stool, no chest pain, no diarrhea and no headaches        Home Medications Prior to Admission medications   Medication Sig Start Date End Date Taking? Authorizing Provider  amLODipine  (NORVASC ) 5 MG tablet TAKE 1 TABLET (5 MG TOTAL) BY MOUTH DAILY. Patient not taking: Reported on 03/28/2015 11/02/11   Roslyn Coombe, MD  ibuprofen  (ADVIL ,MOTRIN ) 800 MG tablet Take 1 tablet (800 mg total) by mouth 3 (three) times daily. 03/29/15   Palumbo, April, MD  losartan  (COZAAR ) 100 MG tablet Take 1 tablet (100 mg total) by mouth daily. Patient not taking: Reported on 03/28/2015 04/02/11 04/01/12  Roslyn Coombe, MD  methocarbamol  (ROBAXIN ) 500 MG tablet Take 1 tablet (500 mg total) by mouth 2 (two) times daily. 03/29/15   Palumbo, April, MD  Olopatadine  HCl 0.2 % SOLN Apply 1 drop to eye 2 (two) times daily as needed. Use for duration of symptoms as needed 04/14/19   Darr, Jacob, PA-C      Allergies    Patient has no known allergies.    Review of Systems   Review of Systems  Constitutional:  Negative for appetite change and fatigue.   HENT:  Negative for congestion, ear discharge and sinus pressure.   Eyes:  Negative for discharge.  Respiratory:  Negative for cough.   Cardiovascular:  Negative for chest pain.  Gastrointestinal:  Negative for abdominal pain, blood in stool and diarrhea.  Genitourinary:  Negative for frequency and hematuria.  Musculoskeletal:  Negative for back pain.  Skin:  Negative for rash.  Neurological:  Positive for dizziness. Negative for seizures and headaches.  Psychiatric/Behavioral:  Negative for hallucinations.     Physical Exam Updated Vital Signs BP (!) 166/99 (BP Location: Left Arm)   Pulse 76   Temp 98 F (36.7 C) (Oral)   Resp 20   Ht 5\' 11"  (1.803 m)   Wt (!) 137 kg   SpO2 99%   BMI 42.12 kg/m  Physical Exam Vitals and nursing note reviewed.  Constitutional:      Appearance: He is well-developed.  HENT:     Head: Normocephalic.     Nose: Nose normal.  Eyes:     General: No scleral icterus.    Conjunctiva/sclera: Conjunctivae normal.  Neck:     Thyroid: No thyromegaly.  Cardiovascular:     Rate and Rhythm: Normal rate and regular rhythm.     Heart sounds: No murmur heard.    No friction rub. No gallop.  Pulmonary:     Breath sounds: No stridor. No wheezing or rales.  Chest:     Chest wall: No tenderness.  Abdominal:     General: There is no distension.     Tenderness: There is no abdominal tenderness. There is no rebound.  Musculoskeletal:        General: Normal range of motion.     Cervical back: Neck supple.  Lymphadenopathy:     Cervical: No cervical adenopathy.  Skin:    Findings: No erythema or rash.  Neurological:     Mental Status: He is alert and oriented to person, place, and time.     Motor: No abnormal muscle tone.     Coordination: Coordination normal.  Psychiatric:        Behavior: Behavior normal.     ED Results / Procedures / Treatments   Labs (all labs ordered are listed, but only abnormal results are displayed) Labs Reviewed  CBC  WITH DIFFERENTIAL/PLATELET - Abnormal; Notable for the following components:      Result Value   WBC 11.6 (*)    Neutro Abs 9.9 (*)    All other components within normal limits  COMPREHENSIVE METABOLIC PANEL WITH GFR - Abnormal; Notable for the following components:   Glucose, Bld 142 (*)    Calcium 8.4 (*)    All other components within normal limits  LIPASE, BLOOD  TROPONIN I (HIGH SENSITIVITY)  TROPONIN I (HIGH SENSITIVITY)    EKG EKG Interpretation Date/Time:  Sunday July 14 2023 19:59:00 EDT Ventricular Rate:  74 PR Interval:  159 QRS Duration:  104 QT Interval:  385 QTC Calculation: 428 R Axis:   -35  Text Interpretation: Sinus rhythm Prominent P waves, nondiagnostic Inferior infarct, old Confirmed by Cheyenne Cotta [phone]) on 07/14/2023 9:42:45 PM  Radiology DG Chest Port 1 View Result Date: 07/14/2023 EXAM: 1 VIEW XRAY OF THE CHEST 07/14/2023 08:47:25 PM COMPARISON: 03/28/2015 CLINICAL HISTORY: 53 y/o male with dizziness, hypertensive, and non-compliance with Amlodipine . FINDINGS: LUNGS AND PLEURA: Mild patchy bilateral lower lobe opacities, atelectasis versus pneumonia. No pleural effusion. No pneumothorax. HEART AND MEDIASTINUM: The heart is top normal in size. No acute abnormality of the cardiac and mediastinal silhouettes. BONES AND SOFT TISSUES: No acute osseous abnormality. IMPRESSION: 1. Mild patchy bilateral lower lobe opacities, atelectasis versus pneumonia. Electronically signed by: Zadie Herter MD 07/14/2023 08:49 PM EDT RP Workstation: [id number]    Procedures Procedures  {Document cardiac monitor, telemetry assessment procedure when appropriate:1}  Medications Ordered in ED Medications  sodium chloride 0.9 % bolus 1,000 mL (1,000 mLs Intravenous New Bag/Given 07/14/23 2120)  ondansetron (ZOFRAN) injection 4 mg (4 mg Intravenous Given 07/14/23 2120)  acetaminophen (TYLENOL) tablet 650 mg (650 mg Oral Given 07/14/23 2119)    ED Course/ Medical Decision Making/  A&P   {Chest x-ray, EKG are both unremarkable CBC shows mild elevated white count of 11.8 chemistries negative.  Patient was given a liter of fluids and feels better.  Second troponin pending Click here for ABCD2, HEART and other calculatorsREFRESH Note before signing :1}                              Medical Decision Making Amount and/or Complexity of Data Reviewed Labs: ordered. Radiology: ordered. ECG/medicine tests: ordered.  Risk OTC drugs. Prescription drug management.  Patient with dizziness and dehydration.  {Document critical care time when appropriate:1} {Document review of labs and clinical decision tools ie heart score, Chads2Vasc2 etc:1}  {Document your independent review of radiology images, and any outside  records:1} {Document your discussion with family members, caretakers, and with consultants:1} {Document social determinants of health affecting pt's care:1} {Document your decision making why or why not admission, treatments were needed:1} Final Clinical Impression(s) / ED Diagnoses Final diagnoses:  None    Rx / DC Orders ED Discharge Orders     None

## 2023-07-14 NOTE — Discharge Instructions (Signed)
 Drink plenty of fluids and follow-up with your family doctor this week for recheck.  Return if any problems

## 2023-07-14 NOTE — ED Triage Notes (Addendum)
 53 y/o male comes in c/o dizziness that started about 45 min ago while out to eat with his family. PT reports he has been moving all day and stating " I didn't drink much water today." PT denies any chest pain, sob or recent illness. PT hypertensive, but reports he did not take his Amlodipine  this am

## 2023-07-14 NOTE — ED Notes (Signed)
 Pt reports his symptoms have resolved, pt denies any dizziness, nausea or discomfort.

## 2023-07-15 LAB — TROPONIN I (HIGH SENSITIVITY): Troponin I (High Sensitivity): 4 ng/L (ref <18)
